# Patient Record
Sex: Female | Born: 1935 | Race: White | Hispanic: No | Smoking: Former smoker
Health system: Southern US, Community
[De-identification: ages and names within clinical notes are randomized; demographics above are authoritative.]

## PROBLEM LIST (undated history)

## (undated) DIAGNOSIS — J438 Other emphysema: Secondary | ICD-10-CM

## (undated) DIAGNOSIS — E538 Deficiency of other specified B group vitamins: Secondary | ICD-10-CM

## (undated) DIAGNOSIS — I639 Cerebral infarction, unspecified: Secondary | ICD-10-CM

## (undated) DIAGNOSIS — D649 Anemia, unspecified: Secondary | ICD-10-CM

## (undated) DIAGNOSIS — J449 Chronic obstructive pulmonary disease, unspecified: Secondary | ICD-10-CM

## (undated) DIAGNOSIS — M810 Age-related osteoporosis without current pathological fracture: Secondary | ICD-10-CM

## (undated) DIAGNOSIS — N183 Chronic kidney disease, stage 3 unspecified: Secondary | ICD-10-CM

## (undated) DIAGNOSIS — F0390 Unspecified dementia without behavioral disturbance: Secondary | ICD-10-CM

## (undated) DIAGNOSIS — Z85828 Personal history of other malignant neoplasm of skin: Secondary | ICD-10-CM

## (undated) HISTORY — DX: Deficiency of other specified B group vitamins: E53.8

## (undated) HISTORY — DX: Anemia, unspecified: D64.9

## (undated) HISTORY — DX: Unspecified dementia, unspecified severity, without behavioral disturbance, psychotic disturbance, mood disturbance, and anxiety: F03.90

## (undated) HISTORY — PX: VESICOVAGINAL FISTULA CLOSURE W/ TAH: SUR271

## (undated) HISTORY — DX: Other emphysema: J43.8

## (undated) HISTORY — PX: TOE AMPUTATION: SHX809

## (undated) HISTORY — DX: Age-related osteoporosis without current pathological fracture: M81.0

## (undated) HISTORY — DX: Chronic kidney disease, stage 3 unspecified: N18.30

## (undated) HISTORY — DX: Chronic kidney disease, stage 3 (moderate): N18.3

## (undated) HISTORY — DX: Chronic obstructive pulmonary disease, unspecified: J44.9

## (undated) HISTORY — DX: Personal history of other malignant neoplasm of skin: Z85.828

---

## 2006-10-08 ENCOUNTER — Ambulatory Visit (HOSPITAL_COMMUNITY): Admission: RE | Admit: 2006-10-08 | Discharge: 2006-10-08 | Payer: Self-pay | Admitting: Family Medicine

## 2006-11-01 ENCOUNTER — Inpatient Hospital Stay (HOSPITAL_COMMUNITY): Admission: EM | Admit: 2006-11-01 | Discharge: 2006-11-03 | Payer: Self-pay | Admitting: Emergency Medicine

## 2006-11-01 ENCOUNTER — Ambulatory Visit: Payer: Self-pay | Admitting: *Deleted

## 2006-11-02 ENCOUNTER — Encounter: Payer: Self-pay | Admitting: Cardiology

## 2009-10-04 ENCOUNTER — Encounter: Admission: RE | Admit: 2009-10-04 | Discharge: 2009-10-04 | Payer: Self-pay | Admitting: Family Medicine

## 2010-07-25 ENCOUNTER — Ambulatory Visit (HOSPITAL_COMMUNITY): Admission: RE | Admit: 2010-07-25 | Discharge: 2010-07-25 | Payer: Self-pay | Admitting: Family Medicine

## 2010-07-25 ENCOUNTER — Encounter: Payer: Self-pay | Admitting: Pulmonary Disease

## 2010-08-03 ENCOUNTER — Ambulatory Visit: Payer: Self-pay | Admitting: Pulmonary Disease

## 2010-08-03 DIAGNOSIS — J438 Other emphysema: Secondary | ICD-10-CM

## 2010-08-03 DIAGNOSIS — Z85828 Personal history of other malignant neoplasm of skin: Secondary | ICD-10-CM

## 2010-09-28 ENCOUNTER — Ambulatory Visit: Payer: Self-pay | Admitting: Pulmonary Disease

## 2010-11-28 NOTE — Assessment & Plan Note (Signed)
Summary: rov for emphysema   Visit Type:  Follow-up Copy to:  Antony Haste MD Primary Brea Coleson/Referring Jazper Nikolai:  Antony Haste MD  CC:  8 week follow up. pt states her breathing is "fine". pt c/o occas cough w/ clear phlem. pt states she currenly smokes <1 ppd. .  History of Present Illness: the pt comes in today for f/u of her known emphysema.  She was started on symbicort at the last visit, but has not really seen a difference in her breathing.  She unfortunately continues to smoke.  She denies any significant cough, congestion, or worsening sob.  Preventive Screening-Counseling & Management  Alcohol-Tobacco     Smoking Status: current     Packs/Day: 1.0     Tobacco Counseling: to quit use of tobacco products  Current Medications (verified): 1)  Alendronate Sodium 70 Mg Tabs (Alendronate Sodium) .... One Tablet By Mouth Once Weekley 2)  Omega 3 .... Once Daily 3)  Garlique 400 Mg Tbec (Garlic) .... Once Daily 4)  Ha Joint Tablets .... Once Daily 5)  Asta Fx Capsules .... Once Daily 6)  Symbicort 160-4.5 Mcg/act  Aero (Budesonide-Formoterol Fumarate) .... Two Puffs Twice Daily 7)  Proair Hfa 108 (90 Base) Mcg/act  Aers (Albuterol Sulfate) .... 2 Puffs Every 4-6 Hours As Needed  Allergies (verified): 1)  ! Asa  Social History: Packs/Day:  1.0  Review of Systems       The patient complains of shortness of breath with activity, shortness of breath at rest, and productive cough.  The patient denies non-productive cough, coughing up blood, chest pain, irregular heartbeats, acid heartburn, indigestion, loss of appetite, weight change, abdominal pain, difficulty swallowing, sore throat, tooth/dental problems, headaches, nasal congestion/difficulty breathing through nose, sneezing, itching, ear ache, anxiety, depression, hand/feet swelling, joint stiffness or pain, rash, change in color of mucus, and fever.    Vital Signs:  Patient profile:   77 year old female Height:      60  inches Weight:      75.50 pounds BMI:     14.80 O2 Sat:      99 % on Room air Temp:     97.4 degrees F oral Pulse rate:   94 / minute BP sitting:   132 / 82  (left arm) Cuff size:   small  Vitals Entered By: Carver Fila (September 28, 2010 9:12 AM)  O2 Flow:  Room air CC: 8 week follow up. pt states her breathing is "fine". pt c/o occas cough w/ clear phlem. pt states she currenly smokes <1 ppd.  Comments meds and allergies updated Phone number updated Mindy Edward Jolly  September 28, 2010 9:12 AM    Physical Exam  General:  thin female in nad Lungs:  decreased bs throughout, no wheezing noted. Heart:  rrr Extremities:  no edema or cyanosis  Neurologic:  alert and oriented, moves all 4.   Impression & Recommendations:  Problem # 1:  EMPHYSEMA (ICD-492.8) the pt feels the symbicort made no difference at all to her breathing, and also let me know that she has no intention of quitting smoking.  I have asked her to discontinue the symbicort, but to stay on as needed albuterol.  We could try spiriva, but she really does not seem interested in staying on maintenance therapy.   I talked to her again about nutrition, pulmonary rehab, and smoking cessation.  Other Orders: Est. Patient Level III (19147) Tobacco use cessation intermediate 3-10 minutes (82956)  Patient Instructions: 1)  stop symbicort and stay on albuterol as needed.  If you see a difference being off the med, can restart. 2)  think about pulmonary rehab program at [facility]. 3)  stop smoking 4)  followup with me in 6mos, but call if breathing issues develop.   Immunization History:  Pneumovax Immunization History:    Pneumovax:  historical (07/29/2008)

## 2010-11-28 NOTE — Assessment & Plan Note (Signed)
Summary: consult for emphysema   Visit Type:  Initial Consult Copy to:  Antony Haste MD Primary Provider/Referring Provider:  Antony Haste MD  CC:  shortness of breath.  History of Present Illness: The pt is a 77y/o female who is referred for evaluation of dyspnea/copd.  The pt has longstanding doe, but the daughter feels that it has gotten much worse from her observations.  The pt denies this.  She had a cxr last in 2008 which did show hyperinflation, and she has had recent spirometry which shows at least moderate airflow obstruction.  The pt has a chronic cough that is usually productive of white foamy mucus, and denies any recent pulmonary infection.  She denies any LE edema or chest pain, but has lost approx. 25 pounds over the last 1-2 yrs.  She currently is not on a bronchodilator regimen, but unfortunately continues to smoke.    Preventive Screening-Counseling & Management  Alcohol-Tobacco     Smoking Status: current  Current Medications (verified): 1)  Alendronate Sodium 70 Mg Tabs (Alendronate Sodium) .... One Tablet By Mouth Once Weekley 2)  Omega 3 .... Once Daily 3)  Garlique 400 Mg Tbec (Garlic) .... Once Daily 4)  Ha Joint Tablets .... Once Daily 5)  Asta Fx Capsules .... Once Daily  Allergies (verified): 1)  ! Jonne Ply  Past History:  Past Medical History:  SKIN CANCER, HX OF (ICD-V10.83) EMPHYSEMA (ICD-492.8)--FEV1  0.82, FEV1% 57..done 2011    Past Surgical History: hysterectomy partly ampuated right big toe  Family History: Reviewed history and no changes required. emphysema--father heart disease--mother mgm--stomach cancer mgf--colon cancer  Social History: Reviewed history and no changes required. Patient is a current smoker. <1ppd. started age 38.  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  The patient denies  shortness of breath at rest, productive cough, non-productive cough, coughing up blood, chest pain, irregular heartbeats, acid heartburn, indigestion, loss of appetite, weight change, abdominal pain, difficulty swallowing, sore throat, tooth/dental problems, headaches, nasal congestion/difficulty breathing through nose, sneezing, itching, ear ache, anxiety, depression, hand/feet swelling, joint stiffness or pain, rash, change in color of mucus, and fever.    Vital Signs:  Patient profile:   77 year old female Height:      60 inches Weight:      77 pounds BMI:     15.09 O2 Sat:      100 % on Room air Temp:     97.6 degrees F oral Pulse rate:   85 / minute BP sitting:   130 / 80  (right arm) Cuff size:   regular  Vitals Entered By: Carver Fila (August 03, 2010 11:27 AM)  O2 Flow:  Room air  Serial Vital Signs/Assessments:  Comments: Ambulatory Pulse Oximetry  Resting; HR_83____    02 Sat_99%ra____  Lap1 (185 feet)   HR_90____   02 Sat__97%ra___ Lap2 (185 feet)   HR__95___   02 Sat__98%ra___    Lap3 (185 feet)   HR__94___   02 Sat__94%ra___  _x__Test Completed without Difficulty ___Test Stopped due to:  Carver Fila  August 03, 2010 12:29 PM  By: Carver Fila   CC: shortness of breath Comments meds and allergies updated Phone number updated Mindy Silva  August 03, 2010 11:27 AM    Physical Exam  General:  thin female in nad Eyes:  PERRLA and EOMI.  Nose:  patent without discharge Mouth:  no exudates or other abnl seen Neck:  no jvd, tmg, LN Lungs:  very decreased bs throughout, no wheezing or rhonchi Heart:  rrr, no mrg Abdomen:  soft and nontender, bs+ Extremities:  no edema or cyanosis, pulses intact distally Neurologic:  alert and oriented, moves all 4.   Impression & Recommendations:  Problem # 1:  EMPHYSEMA (ICD-492.8) the pt has emphysema that is at least moderate by recent spirometry.  I have had a long discussion with her about this, and told her the  #1 treatment before everything else is smoking cessation.  We can try to improve her QOL with a bronchodilator regimen, but ultimately how she does over time will be more dependent upon her ability to quit smoking.  I have also discussed with her the importance of nutrition, and how a lot of her calories and muscle mass are being used for her respiratory muscles.  I stressed the need to increase caloric intake.  Surprisingly, her oxygen level is adequate at rest and with moderate exertion.  Finally, I have discussed the importance of an exercise program and vaccinations.  I offered to send her to pulmonary rehab if she was interested.   Medications Added to Medication List This Visit: 1)  Alendronate Sodium 70 Mg Tabs (Alendronate sodium) .... One tablet by mouth once weekley 2)  Omega 3  .... Once daily 3)  Garlique 400 Mg Tbec (Garlic) .... Once daily 4)  Ha Joint Tablets  .... Once daily 5)  Asta Fx Capsules  .... Once daily 6)  Symbicort 160-4.5 Mcg/act Aero (Budesonide-formoterol fumarate) .... Two puffs twice daily 7)  Proair Hfa 108 (90 Base) Mcg/act Aers (Albuterol sulfate) .... 2 puffs every 4-6 hours as needed  Other Orders: Pulse Oximetry, Ambulatory (95188) Tobacco use cessation intermediate 3-10 minutes (41660) Consultation Level IV (63016) T-2 View CXR (71020TC) Future Orders: Flu Vaccine 39yrs + MEDICARE PATIENTS (W1093) ... 08/04/2010 Administration Flu vaccine - MCR (G0008) ... 08/04/2010  Patient Instructions: 1)  will try symbicort 160/4.5  2 inhalations am and pm....rinse mouth well 2)  can use proair 2 puffs every 6 hrs if needed for rescue 3)  stop smoking! 4)  will check cxr today, and will call you with the results. 5)  followup with me in 8 weeks to check on your progress.  Prescriptions: PROAIR HFA 108 (90 BASE) MCG/ACT  AERS (ALBUTEROL SULFATE) 2 puffs every 4-6 hours as needed  #1 x 6   Entered and Authorized by:   Barbaraann Share MD   Signed by:   Barbaraann Share MD on 08/03/2010   Method used:   Print then Give to Patient   RxID:   [id number] SYMBICORT 160-4.5 MCG/ACT  AERO (BUDESONIDE-FORMOTEROL FUMARATE) Two puffs twice daily  #1 x 6   Entered and Authorized by:   Barbaraann Share MD   Signed by:   Barbaraann Share MD on 08/03/2010   Method used:   Print then Give to Patient   RxID:   [id number]     Immunization History:  Influenza Immunization History:    Influenza:  historical (07/29/2009)       Flu Vaccine Consent Questions     Do you have a history of severe allergic reactions to this vaccine? no    Any prior history of allergic reactions to egg and/or gelatin? no    Do you have a sensitivity to the preservative Thimersol? no  Do you have a past history of Guillan-Barre Syndrome? no    Do you currently have an acute febrile illness? no    Have you ever had a severe reaction to latex? no    Vaccine information given and explained to patient? yes    Are you currently pregnant? no    Lot Number:[id number]   Exp Date:04/28/2011   Site Given  Left hip IMdflu

## 2011-03-16 NOTE — H&P (Signed)
NAMEPAISLYN, Donna Burke NO.:  [id number]   MEDICAL RECORD NO.:  [id number]          PATIENT TYPE:  INP   LOCATION:  1825                         FACILITY:  [facility]   PHYSICIAN:  Rod Holler, MD     DATE OF BIRTH:  1934/05/20   DATE OF ADMISSION:  11/01/2006  DATE OF DISCHARGE:                              HISTORY & PHYSICAL   HISTORY OF PRESENT ILLNESS:  [name] is a 77 year old female with an  extensive tobacco history, hyperlipidemia, who presents to emergency  department with complaint of shortness of breath. Approximately 1 week  ago, the patient was treated for an upper respiratory infection with  antibiotics. Over the past day, the patient has had progressively  worsening shortness of breath at all times. The patient has also  complained of a cough productive of white sputum. She has had no chest  pain. She has had no episodes of palpitations, no paroxysmal nocturnal  dyspnea, orthopnea, no lower extremity swelling. She has no fevers,  chills, night sweats. She does have a cough productive of white sputum.  In the emergency department the patient was initially treated with  albuterol and Atrovent, along with Solu-Medrol. The patient was also  written to receive aspirin, heparin bolus, and nitroglycerin.   PAST MEDICAL HISTORY:  1. Ongoing tobacco use.  2. Hyperlipidemia.  3. Osteoporosis.  4. Degenerative disk disease.   MEDICATIONS:  1. Zocor.  2. Fosamax.   ALLERGIES:  No known drug allergies.   SOCIAL HISTORY:  The patient smokes one pack per day and has done so for  over 60 years. She is widowed, lives by herself.   FAMILY HISTORY:  Mother with history of coronary artery disease status  post CABG. Coronary disease started in her 67's.   REVIEW OF SYSTEMS:  All systems were reviewed in detail and are negative  except as noted in the history of present illness.   PHYSICAL EXAMINATION:  VITAL SIGNS: Blood pressure 172/95, heart rate  94,  oxygen saturation 95% on 2 liters by nasal cannula. Temperature 97.  Respiratory rate currently 16.  GENERAL: A thin, elderly female, alert and oriented x3, in no  respiratory distress.  HEENT:  Head atraumatic normocephalic. Pupils are equal, round and  reactive to light. Extraocular movements are intact. Oropharynx clear.  NECK: Supple, no adenopathy.  CHEST: Diffuse end expiratory wheezing, equal breath sounds bilaterally,  prolonged end expiratory phase.  CORONARY: Distant heart sounds, regular, no murmurs, rubs, or gallops.  ABDOMEN: Soft, nontender, nondistended. Active bowel sounds.  EXTREMITIES: No clubbing, cyanosis or edema.  NEUROLOGIC: No focal deficits.   EKG shows normal sinus rhythm, poor quality baseline, no obvious ST or T  wave changes.   Chest x-ray consistent with chronic obstructive pulmonary disease,  bibasilar scarring, no edema.   LABORATORY DATA:  D-dimer 0.35. BNP 192. Hematocrit 45. Sodium 139,  potassium 4.8, chloride 112, bicarb 23, BUN 41, creatinine 1.5, glucose  116.   IMPRESSION:  Probable chronic obstructive pulmonary disease  exacerbation, chest x-ray with COPD changes. The patient's shortness of  breath improved  with nebulizer treatments in the emergency department.  The patient has had no episodes of chest pain, had a peak troponin of  0.08, last troponin of less than 0.05.   PLAN:  1. Pulmonary. Admit the patient to a telemetry bed, albuterol,      Atrovent nebulizer treatments q.4 hours, prednisone 60 mg p.o.      daily starting tomorrow, azithromycin 500 mg p.o. x1, then 250 mg      p.o. daily x4 days, oxygen by nasal cannula to keep oxygen      saturation greater than 90%.  2. Cardiovascular. Telemetry, rule out serial cardiac enzymes,      aspirin, Zocor, no beta blocker given the patient's lung disease.      Hold heparin and nitroglycerin at present. Transthoracic      echocardiogram. Daily EKG x3 days. Lipid panel in the morning.  3.  GI. Protonix daily.  4. Fluid, electrolytes, nutrition. Regular diet.  5. Renal. I will hold ACE inhibitor given her renal function, and will      recheck renal function in the morning.  6. Morning labs. CMP, CBC, lipid panel, thyroid function tests,      magnesium, coagulation panel.  7. Prophylaxis. Heparin 5000 units SQ t.i.d.    Dictated by Joaquim Lai, M.D.      Rod Holler, MD  Electronically Signed     TRK/MEDQ  D:  11/01/2006  T:  11/01/2006  Job:  [id number]

## 2011-03-16 NOTE — Discharge Summary (Signed)
NAMESHANEQUE, MERKLE NO.:  [id number]   MEDICAL RECORD NO.:  [id number]          PATIENT TYPE:  INP   LOCATION:  3714                         FACILITY:  [facility]   PHYSICIAN:  Jonelle Sidle, MD DATE OF BIRTH:  10/31/1933   DATE OF ADMISSION:  11/01/2006  DATE OF DISCHARGE:  11/03/2006                               DISCHARGE SUMMARY   PRIMARY CARE PHYSICIAN:  Ms. Renette Butters, [place].A. with [facility].   PROCEDURE PERFORMED:  None.   HISTORY OF PRESENT ILLNESS:  A 77 year old Caucasian female with a  history of tobacco abuse, hyperlipidemia presented to the emergency  department with complaints of increasing shortness of breath.  Approximately 1 week prior, the patient was treated for upper  respiratory infection with antibiotics.  Over the past 24 hours prior to  admission, the patient had progressively worsening shortness of breath  at all times with also productive cough with white sputum.  She denied  any chest pain or shortness of breath.  No episodes of palpitation, PND,  orthopnea, or lower extremity edema.  She also denied any fevers,  chills, or night sweats.  Her main complaint was shortness of breath  with a productive cough.   HOSPITAL COURSE:  The patient was seen and examined by Dr. Joaquim Lai,  cardiology from [facility].  The patient was initially treated with  Albuterol and Atrovent inhalers and started on Solu-Medrol.  The patient  also was started on heparin bolus and nitroglycerine prophylactically  and the patient was admitted to rule out cardiovascular etiology for  shortness of breath.  The patient was also started back on Zithromax 250  mg 1 p.o. daily and started on Combivent inhaler along with continued  prednisone dosing during the hospitalization.  On arrival the patient's  blood pressure was found to be 172/95, heart rate 94, O2 sat 95% on 2  liters by nasal cannula, temperature 97.  The patient was breathing at  rate of  16-20 per minute.   Chest x-ray was completed on admission revealing advanced changes of  COPD with probable bibasilar scaring or atelectasis.  No edema,  effusions, or focal infiltrates were noted.  Echocardiogram was also  completed revealing left ventricle with small overall left ventricular  systolic function, normal left ventricular ejection fraction was  estimated at a range being 60-65% with no left ventricular regional wall  motion abnormalities.  There was mild right ventricular hypertrophy,  mild tricuspid valvular regurg.  There appeared to be a small  pericardial effusion anterior to the heart.   The patient was also consulted on smoking cessation during  hospitalization and Chantix was offered as an outpatient.  The patient's  initial BNP was found to be 192 on admission.  The patient's troponin's  were found to be negative throughout hospitalization.  Troponin was  found to be 0.05, BNP 192.  Subsequent troponins were 0.01, 0.01, and  0.01 respectively.  The patient's breathing status improved and she was  seen and examined by Dr. Diona Browner on day of discharge and found to be  stable for discharge.  LABORATORY DATA:  Hemoglobin 12.1, hematocrit 35.9, white blood cells  5.3, platelets 289.  PT 13.8, INR 1.0, PTT 114.  D-dimer 0.35.  Sodium  143, potassium 5.0, chloride 108, CO2 26, glucose 114, BUN 50,  creatinine 1.45.  Cholesterol 107, triglycerides 45, HDL 64, LDL 34.  TSH 0.24.   DISCHARGE BLOOD PRESSURE:  Systolic blood pressure is running 97-120,  heart rate 80s to 90s, O2 sat 96% on room air.   DISPOSITION/INSTRUCTIONS:  1. The patient will up with her primary care physician Ms. Debera Lat.  The patient is to call for followup appointment.  Cardiology      follow-up can be PRN.  2. The patient has been given lengthy instructions concerning her home      medications to include prednisone at a divided dose and decreased      regiment over the next 3 weeks.   The patient has also been given      instructions on Chantix with directions provided on dosing.  The      patient has been started on a new medication Cardizem 120 mg 1 p.o.      daily and this has been explained to her as well.  She will      continue her other medications as directed.   DISCHARGE MEDICATIONS:  1. Zithromax 250 mg 1 p.o. daily x5 days.  2. Predinsone 60 mg daily x4 days, then 40 mg daily x4 days, 20 mg      daily x4 days, and 10 mg daily x4 days respectively, and then      discontinue.  3. Combivent MDI q.i.d. 2 puffs.  4. Chantix as directed 0.5 mg 1 p.o. daily x3 days, then 0.5 mg 1 p.o.      b.i.d. x4 days, then 1 mg p.o. b.i.d. for 11 weeks.  5. Zocor 20 mg once a day.  6. Cardizem 120 mg once a day.  7. Aspirin 325 once a day.  8. Protonix 40 mg once a day.   ALLERGIES:  ASPIRIN.   Time spent with patient to include physician time, 35 minutes.      Bettey Mare. Lyman Bishop, NP      Jonelle Sidle, MD  Electronically Signed    KML/MEDQ  D:  11/03/2006  T:  11/03/2006  Job:  [id number]

## 2011-03-20 ENCOUNTER — Encounter: Payer: Self-pay | Admitting: Pulmonary Disease

## 2011-03-28 ENCOUNTER — Ambulatory Visit: Payer: Self-pay | Admitting: Pulmonary Disease

## 2011-10-04 ENCOUNTER — Other Ambulatory Visit: Payer: Self-pay | Admitting: Pulmonary Disease

## 2012-09-24 ENCOUNTER — Other Ambulatory Visit: Payer: Self-pay | Admitting: Family Medicine

## 2012-09-24 DIAGNOSIS — M79606 Pain in leg, unspecified: Secondary | ICD-10-CM

## 2012-09-30 ENCOUNTER — Ambulatory Visit
Admission: RE | Admit: 2012-09-30 | Discharge: 2012-09-30 | Disposition: A | Discharge status: Home / Self Care | Payer: Medicare Other | Source: Ambulatory Visit | Attending: Family Medicine | Admitting: Family Medicine

## 2012-09-30 DIAGNOSIS — M79606 Pain in leg, unspecified: Secondary | ICD-10-CM

## 2013-01-01 ENCOUNTER — Other Ambulatory Visit: Payer: Self-pay | Admitting: Pulmonary Disease

## 2013-03-24 ENCOUNTER — Other Ambulatory Visit: Payer: Self-pay | Admitting: *Deleted

## 2013-03-24 ENCOUNTER — Encounter: Payer: Self-pay | Admitting: Neurology

## 2013-03-24 ENCOUNTER — Ambulatory Visit (INDEPENDENT_AMBULATORY_CARE_PROVIDER_SITE_OTHER): Payer: Medicare Other | Admitting: Neurology

## 2013-03-24 VITALS — BP 107/66 | HR 89 | Temp 97.5°F | Ht 59.0 in | Wt 79.0 lb

## 2013-03-24 DIAGNOSIS — G309 Alzheimer's disease, unspecified: Secondary | ICD-10-CM

## 2013-03-24 DIAGNOSIS — F068 Other specified mental disorders due to known physiological condition: Secondary | ICD-10-CM

## 2013-03-24 DIAGNOSIS — I6350 Cerebral infarction due to unspecified occlusion or stenosis of unspecified cerebral artery: Secondary | ICD-10-CM

## 2013-03-24 DIAGNOSIS — F0280 Dementia in other diseases classified elsewhere without behavioral disturbance: Secondary | ICD-10-CM | POA: Insufficient documentation

## 2013-03-24 DIAGNOSIS — I6381 Other cerebral infarction due to occlusion or stenosis of small artery: Secondary | ICD-10-CM | POA: Insufficient documentation

## 2013-03-24 NOTE — Progress Notes (Signed)
[facility] [address] [place]. [place] [address] [phone]       OFFICE FOLLOW-UP NOTE  [name] Date of Birth:  07/03/1934 Medical Record Number:  [id number]   HPI:79 year old lady with left thalamic infarct in October 2013 with prior history of multiple TIAs and long-standing mild dementia of  Alzheimer`s type . She returns for followup of her last visit on 12/23/2012. She is accompanied by her daughter. Her short-term memory difficulties, cognitive impairment remain unchanged. She continues to live alone with close supervision by her family. She needs a lot of help from her family though she denies this and minimizes her symptoms. She has not had any falls though she continues to have mild balance problems. She denies any delusions, hallucinations or significant changes in behavior hormone he did she continues to lack insight into her cognitive difficulties and states there is nothing wrong and minimizes her problem. She has not had any stroke or TIA symptoms and remains on aspirin.   ROS:   14 system review of systems is positive for  easy bruising, bleeding, memory loss, hearing loss  PMH:  Past Medical History  Diagnosis Date  . Personal history of other malignant neoplasm of skin   . Other emphysema   . Dementia   . COPD (chronic obstructive pulmonary disease)   . Anemia   . Osteoporosis   . Vitamin B 12 deficiency   . CKD (chronic kidney disease), stage III     Social History:  History   Social History  . Marital Status: Divorced    Spouse Name: N/A    Number of Children: N/A  . Years of Education: N/A   Occupational History  . Retired    Social History Main Topics  . Smoking status: Current Every Day Smoker  . Smokeless tobacco: Not on file     Comment: < 1ppd  . Alcohol Use: Not on file  . Drug Use: Not on file  . Sexually Active: Not on file   Other Topics Concern  . Not on file   Social History Narrative  . No narrative on  file    Medications:   Current Outpatient Prescriptions on File Prior to Visit  Medication Sig Dispense Refill  . albuterol (PROAIR HFA) 108 (90 BASE) MCG/ACT inhaler Inhale 2 puffs into the lungs every 6 (six) hours as needed.        Marland Kitchen alendronate (FOSAMAX) 70 MG tablet Take 70 mg by mouth every 7 (seven) days. Take with a full glass of water on an empty stomach.       . budesonide-formoterol (SYMBICORT) 160-4.5 MCG/ACT inhaler Inhale 2 puffs into the lungs 2 (two) times daily.        . Garlic (GARLIQUE) 400 MG TBEC Take 1 tablet by mouth daily.        . OMEGA 3 1000 MG CAPS Take 1 capsule by mouth daily.         No current facility-administered medications on file prior to visit.    Allergies:   Allergies  Allergen Reactions  . Aspirin     REACTION: nausea    Physical Exam General: Frail petite elderly Caucasian lady, seated, in no evident distress Head: head normocephalic and atraumatic. Orohparynx benign Neck: supple with no carotid or supraclavicular bruits Cardiovascular: regular rate and rhythm, no murmurs Musculoskeletal: no deformity. Mild kyphosis Skin:  no rash/petichiae Vascular:  Normal pulses all extremities  Neurologic Exam Mental Status: Awake and fully alert. Oriented  to place and time. Recent and remote memory diminished t. Attention span, concentration and fund of knowledge diminished Mood and affect appropriate. Mini-Mental status exam score 25/30 with deficits in orientation, recall. Animal fluency test 7. Clock drawing 3/4. Geriatric depression scale and not depressed Cranial Nerves: Fundoscopic exam reveals sharp disc margins. Pupils equal, briskly reactive to light. Extraocular movements full without nystagmus. Visual fields full to confrontation. Hearing intact. Facial sensation intact. Face, tongue, palate moves normally and symmetrically.  Motor: Normal bulk and tone. Normal strength in all tested extremity muscles. Sensory.: intact to tough and pinprick  and vibratory.  Coordination: Rapid alternating movements normal in all extremities. Finger-to-nose and heel-to-shin performed accurately bilaterally. Gait and Station: Arises from chair without difficulty. Stance is normal. Gait demonstrates normal stride length and balance . Unable to heel, toe and tandem walk without difficulty.  Reflexes: 1+ and symmetric. Toes downgoing.     ASSESSMENT: 79 year old lady with left thalamic infarct in October 2013 with prior history of multiple TIAs and long-standing mild dementia of  Alzheimer`s type .    PLAN: Continue aspirin for stroke prevention and fish oil and cephalad and a C4 dementia. She has been unable to tolerate Aricept and Exelon in the past. Consider participation in the [facility] 3 dementia trial. Return for followup in 3 months.

## 2013-03-24 NOTE — Patient Instructions (Signed)
Continue aspirin for stroke prevention and fish oil and Cerefolin NAC for dementia. Patient has been intolerant to Aricept and Exelon in the past. Consider participating in the [place] 3 dementia trial if interested. Return for followup in 3 months.

## 2013-07-09 ENCOUNTER — Ambulatory Visit: Payer: Medicare Other | Admitting: Neurology

## 2013-09-21 ENCOUNTER — Encounter: Payer: Self-pay | Admitting: Neurology

## 2013-09-21 ENCOUNTER — Encounter (INDEPENDENT_AMBULATORY_CARE_PROVIDER_SITE_OTHER): Payer: Self-pay

## 2013-09-21 ENCOUNTER — Ambulatory Visit (INDEPENDENT_AMBULATORY_CARE_PROVIDER_SITE_OTHER): Payer: Medicare Other | Admitting: Neurology

## 2013-09-21 VITALS — BP 85/66 | HR 108 | Ht 59.0 in | Wt 80.0 lb

## 2013-09-21 DIAGNOSIS — F0280 Dementia in other diseases classified elsewhere without behavioral disturbance: Secondary | ICD-10-CM

## 2013-09-21 NOTE — Progress Notes (Signed)
[facility] [address] [place]. [place] [address] [phone]       OFFICE FOLLOW-UP NOTE  Donna Burke Date of Birth:  [birth year] Medical Record Number:  [id number]   HPI:79 year old lady with left thalamic infarct in October 2013 with prior history of multiple TIAs and long-standing mild dementia of  Alzheimer`s type . She returns for followup of her last visit on 12/23/2012. She is accompanied by her daughter. Her short-term memory difficulties, cognitive impairment remain unchanged. She continues to live alone with close supervision by her family. She needs a lot of help from her family though she denies this and minimizes her symptoms. She has not had any falls though she continues to have mild balance problems. She denies any delusions, hallucinations or significant changes in behavior hormone he did she continues to lack insight into her cognitive difficulties and states there is nothing wrong and minimizes her problem. She has not had any stroke or TIA symptoms and remains on aspirin.  Update 09/21/13  She returns for f/u after last visit 03/24/13. She is accompanied by her daughter who states she has noticed worsening memory loss and cognitive decline. She lives alone but daughter has been helping her a lot more .She has not had any new health problems.she has no problems with gait, balance,hallucinations, agitation, behaviour problems.Daughter is thinking about long term care plans now.She has not tolerated aricept and exelon and decided not to participate in [facility] 3 trial. ROS:   14 system review of systems is positive for  easy bruising, bleeding, memory loss, hearing loss,confusion  PMH:  Past Medical History  Diagnosis Date  . Personal history of other malignant neoplasm of skin   . Other emphysema   . Dementia   . COPD (chronic obstructive pulmonary disease)   . Anemia   . Osteoporosis   . Vitamin B 12 deficiency   . CKD (chronic kidney disease),  stage III     Social History:  History   Social History  . Marital Status: Divorced    Spouse Name: N/A    Number of Children: N/A  . Years of Education: N/A   Occupational History  . Retired    Social History Main Topics  . Smoking status: Current Every Day Smoker  . Smokeless tobacco: Not on file     Comment: < 1ppd  . Alcohol Use: Not on file  . Drug Use: Not on file  . Sexual Activity: Not on file   Other Topics Concern  . Not on file   Social History Narrative  . No narrative on file    Medications:   Current Outpatient Prescriptions on File Prior to Visit  Medication Sig Dispense Refill  . budesonide-formoterol (SYMBICORT) 160-4.5 MCG/ACT inhaler Inhale 2 puffs into the lungs 2 (two) times daily.        Marland Kitchen FOLIC ACID PO Take by mouth. Take as directed      . OMEGA 3 1000 MG CAPS Take 1 capsule by mouth daily.        . RESTASIS 0.05 % ophthalmic emulsion       . albuterol (PROAIR HFA) 108 (90 BASE) MCG/ACT inhaler Inhale 2 puffs into the lungs every 6 (six) hours as needed.        Marland Kitchen alendronate (FOSAMAX) 70 MG tablet Take 70 mg by mouth every 7 (seven) days. Take with a full glass of water on an empty stomach.       . Garlic (GARLIQUE) 400 MG  TBEC Take 1 tablet by mouth daily.        Marland Kitchen L-Methylfolate-B12-B6-B2 (CEREFOLIN PO) Take by mouth. Take as directed      . terbinafine (LAMISIL) 250 MG tablet       . tobramycin-dexamethasone (TOBRADEX) ophthalmic solution        No current facility-administered medications on file prior to visit.    Allergies:   Allergies  Allergen Reactions  . Aspirin     REACTION: nausea   Filed Vitals:   09/21/13 1436  BP: 85/66  Pulse: 108     Physical Exam General: Frail petite elderly Caucasian lady, seated, in no evident distress Head: head normocephalic and atraumatic. Orohparynx benign Neck: supple with no carotid or supraclavicular bruits Cardiovascular: regular rate and rhythm, no murmurs Musculoskeletal: no  deformity. Mild kyphosis Skin:  no rash/petichiae Vascular:  Normal pulses all extremities  Neurologic Exam Mental Status: Awake and fully alert. Oriented to place and time. Recent and remote memory diminished t. Attention span, concentration and fund of knowledge diminished Mood and affect appropriate. Mini-Mental status exam score 25/30 with deficits in orientation, recall. Animal fluency test 7. Clock drawing 3/4. Geriatric depression scale and not depressed Cranial Nerves: Fundoscopic exam reveals sharp disc margins. Pupils equal, briskly reactive to light. Extraocular movements full without nystagmus. Visual fields full to confrontation. Hearing intact. Facial sensation intact. Face, tongue, palate moves normally and symmetrically.  Motor: Normal bulk and tone. Normal strength in all tested extremity muscles. Sensory.: intact to touch and pinprick and vibratory sensation.  Coordination: Rapid alternating movements normal in all extremities. Finger-to-nose and heel-to-shin performed accurately bilaterally. Gait and Station: Arises from chair without difficulty. Stance is normal. Gait demonstrates normal stride length and balance . Unable to heel, toe and tandem walk without difficulty.  Reflexes: 1+ and symmetric. Toes downgoing.     ASSESSMENT: 79 year old lady with left thalamic infarct in October 2013 with prior history of multiple TIAs and long-standing mild dementia of  Alzheimer`s type .    PLAN:  Patient was given a starter pack of of Namenda to start at 5 mg daily and titrate up to 20 mg. If patient can tolerate this will switch her to Namenda XR 28 mg daily. I discussed possible side effects with the patient's daughter and answered questions. She was also advised to look at his assisted living situation due to patient's worsening dementia . She was advised to call back in a month to get a prescription for Namenda 28 mg XR. Return for followup in 3 months or call earlier if  necessary.

## 2013-09-21 NOTE — Patient Instructions (Signed)
Patient was given a starter pack of of Namenda to start at 5 mg daily and titrate up to 20 mg. If patient can tolerate this will switch her to Namenda XR 28 mg daily. I discussed possible side effects with the patient's daughter and answered questions. She was also advised to look at his assisted living situation due to patient's worsening dementia . She was advised to call back in a month to get a prescription for Namenda 28 mg XR. Return for followup in 3 months or call earlier if necessary.

## 2013-11-25 ENCOUNTER — Ambulatory Visit: Payer: Medicare Other | Admitting: Neurology

## 2016-03-28 ENCOUNTER — Other Ambulatory Visit: Payer: Self-pay | Admitting: Orthopedic Surgery

## 2016-04-26 ENCOUNTER — Ambulatory Visit (HOSPITAL_BASED_OUTPATIENT_CLINIC_OR_DEPARTMENT_OTHER): Admission: RE | Admit: 2016-04-26 | Payer: Medicare Other | Source: Ambulatory Visit | Admitting: Orthopedic Surgery

## 2016-04-26 ENCOUNTER — Encounter (HOSPITAL_BASED_OUTPATIENT_CLINIC_OR_DEPARTMENT_OTHER): Admission: RE | Payer: Self-pay | Source: Ambulatory Visit

## 2016-04-26 SURGERY — EXCISION MASS
Anesthesia: Choice | Laterality: Left

## 2017-02-20 ENCOUNTER — Encounter (HOSPITAL_COMMUNITY): Payer: Self-pay | Admitting: Emergency Medicine

## 2017-02-20 ENCOUNTER — Emergency Department (HOSPITAL_COMMUNITY): Payer: Medicare HMO

## 2017-02-20 ENCOUNTER — Emergency Department (HOSPITAL_COMMUNITY)
Admission: EM | Admit: 2017-02-20 | Discharge: 2017-02-20 | Disposition: A | Discharge status: Home / Self Care | Payer: Medicare HMO | Attending: Emergency Medicine | Admitting: Emergency Medicine

## 2017-02-20 DIAGNOSIS — Y92002 Bathroom of unspecified non-institutional (private) residence single-family (private) house as the place of occurrence of the external cause: Secondary | ICD-10-CM | POA: Diagnosis not present

## 2017-02-20 DIAGNOSIS — Z85828 Personal history of other malignant neoplasm of skin: Secondary | ICD-10-CM | POA: Diagnosis not present

## 2017-02-20 DIAGNOSIS — S42211A Unspecified displaced fracture of surgical neck of right humerus, initial encounter for closed fracture: Secondary | ICD-10-CM | POA: Diagnosis not present

## 2017-02-20 DIAGNOSIS — J449 Chronic obstructive pulmonary disease, unspecified: Secondary | ICD-10-CM | POA: Insufficient documentation

## 2017-02-20 DIAGNOSIS — Z79899 Other long term (current) drug therapy: Secondary | ICD-10-CM | POA: Insufficient documentation

## 2017-02-20 DIAGNOSIS — Z87891 Personal history of nicotine dependence: Secondary | ICD-10-CM | POA: Diagnosis not present

## 2017-02-20 DIAGNOSIS — S42291A Other displaced fracture of upper end of right humerus, initial encounter for closed fracture: Secondary | ICD-10-CM

## 2017-02-20 DIAGNOSIS — Z8673 Personal history of transient ischemic attack (TIA), and cerebral infarction without residual deficits: Secondary | ICD-10-CM | POA: Diagnosis not present

## 2017-02-20 DIAGNOSIS — W182XXA Fall in (into) shower or empty bathtub, initial encounter: Secondary | ICD-10-CM | POA: Insufficient documentation

## 2017-02-20 DIAGNOSIS — Y9389 Activity, other specified: Secondary | ICD-10-CM | POA: Insufficient documentation

## 2017-02-20 DIAGNOSIS — N183 Chronic kidney disease, stage 3 (moderate): Secondary | ICD-10-CM | POA: Insufficient documentation

## 2017-02-20 DIAGNOSIS — Y999 Unspecified external cause status: Secondary | ICD-10-CM | POA: Insufficient documentation

## 2017-02-20 DIAGNOSIS — Z7982 Long term (current) use of aspirin: Secondary | ICD-10-CM | POA: Insufficient documentation

## 2017-02-20 DIAGNOSIS — G309 Alzheimer's disease, unspecified: Secondary | ICD-10-CM | POA: Diagnosis not present

## 2017-02-20 DIAGNOSIS — W19XXXA Unspecified fall, initial encounter: Secondary | ICD-10-CM

## 2017-02-20 DIAGNOSIS — S4991XA Unspecified injury of right shoulder and upper arm, initial encounter: Secondary | ICD-10-CM | POA: Diagnosis present

## 2017-02-20 HISTORY — DX: Cerebral infarction, unspecified: I63.9

## 2017-02-20 MED ORDER — TRAMADOL HCL 50 MG PO TABS: 50.0000 mg | ORAL_TABLET | Freq: Four times a day (QID) | ORAL | 0 refills | Status: AC | PRN

## 2017-02-20 MED ORDER — POLYETHYLENE GLYCOL 3350 17 G PO PACK: 17.0000 g | PACK | Freq: Every day | ORAL | 0 refills | Status: AC

## 2017-02-20 MED ORDER — TRAMADOL HCL 50 MG PO TABS
50.0000 mg | ORAL_TABLET | Freq: Once | ORAL | Status: AC
  Administered 2017-02-20: 50 mg via ORAL
  Filled 2017-02-20: qty 1

## 2017-02-20 MED ORDER — FENTANYL CITRATE (PF) 100 MCG/2ML IJ SOLN
25.0000 ug | Freq: Once | INTRAMUSCULAR | Status: AC
  Administered 2017-02-20: 25 ug via INTRAVENOUS
  Filled 2017-02-20: qty 2

## 2017-02-20 NOTE — Discharge Instructions (Signed)
Please follow-up with orthopedic surgery regarding her fracture in 1-2 weeks. Keep sling for comfort. Medications as prescribed. Return without fail for worsening symptoms, including recurrent falls, passing out, new numbness or weakness of the arm, escalating pain, or any other symptoms concerning to you

## 2017-02-20 NOTE — ED Triage Notes (Signed)
Pt st's she fell this am in bathtub.  Pt c/o pain to right shoulder, right humerus and right elbow.  Strong radial pulse present.

## 2017-02-20 NOTE — ED Provider Notes (Signed)
[facility]-EMERGENCY DEPT Provider Note   CSN: [id number] Arrival date & time: 02/20/17  1625     History   Chief Complaint Chief Complaint  Patient presents with  . Fall    HPI Donna Burke is a 83 y.o. female.  The history is provided by the patient.  Fall  This is a new problem. The current episode started 6 to 12 hours ago. The problem occurs rarely. The problem has been resolved. Pertinent negatives include no chest pain, no abdominal pain, no headaches and no shortness of breath. Exacerbated by: movement of right arm. Nothing relieves the symptoms. She has tried nothing for the symptoms.   83 year old female who presents after fall with right arm pain. She has a history of dementia, prior CVA, COPD, and CK D. She does not take any blood thinners. States that she fell early this morning, it is unclear of what caused her fall. Her daughter suspects that she lost her balance while she was getting up to use the toilet. Patient is unsure of if she hit her head or had loss of consciousness. Since then has been complaining of right shoulder and arm pain. Did not take any medications prior to arrival. Pain is worse with movement of the right arm and palpation. She denies any headache, vision changes, neck pain or back pain, abdominal pain, difficulty breathing or chest pain. Otherwise has been in her usual state of health.   Past Medical History:  Diagnosis Date  . Anemia   . CKD (chronic kidney disease), stage III   . COPD (chronic obstructive pulmonary disease) (HCC)   . Dementia   . Osteoporosis   . Other emphysema (HCC)   . Personal history of other malignant neoplasm of skin   . Stroke (HCC)   . Vitamin B 12 deficiency     Patient Active Problem List   Diagnosis Date Noted  . Alzheimer's disease 03/24/2013  . 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 Lacunar infarction (HCC) 03/24/2013  . EMPHYSEMA 08/03/2010  . SKIN CANCER, HX OF  08/03/2010    Past Surgical History:  Procedure Laterality Date  . TOE AMPUTATION    . VESICOVAGINAL FISTULA CLOSURE W/ TAH      OB History    No data available       Home Medications    Prior to Admission medications   Medication Sig Start Date End Date Taking? Authorizing Provider  albuterol (PROAIR HFA) 108 (90 BASE) MCG/ACT inhaler Inhale 2 puffs into the lungs every 6 (six) hours as needed.      Historical Provider, MD  alendronate (FOSAMAX) 70 MG tablet Take 70 mg by mouth every 7 (seven) days. Take with a full glass of water on an empty stomach.     Historical Provider, MD  aspirin 81 MG tablet Take 81 mg by mouth daily.    Historical Provider, MD  budesonide-formoterol (SYMBICORT) 160-4.5 MCG/ACT inhaler Inhale 2 puffs into the lungs 2 (two) times daily.      Historical Provider, MD  Calcium-Magnesium (CAL-MAG PO) Take by mouth.    Historical Provider, MD  cholecalciferol (VITAMIN D) 1000 UNITS tablet Take 1,000 Units by mouth daily. 2 tabs QD 4000    Historical Provider, MD  FOLIC ACID PO Take by mouth. Take as directed    Historical Provider, MD  Garlic (GARLIQUE) 400 MG TBEC Take 1 tablet by mouth daily.      Historical Provider, MD  L-Methylfolate-B12-B6-B2 (CEREFOLIN PO)  Take by mouth. Take as directed    Historical Provider, MD  multivitamin-lutein (OCUVITE-LUTEIN) CAPS capsule Take 1 capsule by mouth daily.    Historical Provider, MD  OMEGA 3 1000 MG CAPS Take 1 capsule by mouth daily.      Historical Provider, MD  polyethylene glycol (MIRALAX / GLYCOLAX) packet Take 17 g by mouth daily. 02/20/17   Lavera Guise, MD  RESTASIS 0.05 % ophthalmic emulsion  02/04/13   Historical Provider, MD  terbinafine (LAMISIL) 250 MG tablet  01/01/13   Historical Provider, MD  tobramycin-dexamethasone Wallene Dales) ophthalmic solution  03/11/13   Historical Provider, MD  traMADol (ULTRAM) 50 MG tablet Take 1 tablet (50 mg total) by mouth every 6 (six) hours as needed for moderate pain or severe  pain. 02/20/17   Lavera Guise, MD    Family History Family History  Problem Relation Age of Onset  . Emphysema Father   . Heart disease Mother   . Colon cancer Maternal Grandmother   . Stomach cancer Maternal Grandfather     Social History Social History  Substance Use Topics  . Smoking status: Former Games developer  . Smokeless tobacco: Never Used     Comment: < 1ppd  . Alcohol use No     Allergies   Aspirin   Review of Systems Review of Systems  Constitutional: Negative for fever.  Respiratory: Negative for shortness of breath.   Cardiovascular: Negative for chest pain.  Gastrointestinal: Negative for abdominal pain.  Musculoskeletal: Negative for back pain and neck pain.  Skin: Negative for wound.  Allergic/Immunologic: Negative for immunocompromised state.  Neurological: Negative for headaches.  Hematological: Does not bruise/bleed easily.  Psychiatric/Behavioral: Negative for confusion.  All other systems reviewed and are negative.    Physical Exam Updated Vital Signs BP 92/61   Pulse 99   Temp 97.5 F (36.4 C) (Oral)   Resp 11   Ht  (1.422 m)   Wt 72 lb (32.7 kg)   SpO2 96%   BMI 16.14 kg/m   Physical Exam Physical Exam  Nursing note and vitals reviewed. Constitutional: Elderly and frail appearing woman, non-toxic, and in no acute distress. Does appear uncomfortable secondary to pain Head: Normocephalic and atraumatic.  Eyes: PERRL, EOMI Mouth/Throat: Oropharynx is clear and moist.  Neck: Normal range of motion. Neck supple. no cervical spine tenderness Cardiovascular: Normal rate and regular rhythm.   +2 radial pulses bilaterally Pulmonary/Chest: Effort normal and breath sounds normal. no chest wall tenderness Abdominal: Soft. There is no tenderness. There is no rebound and no guarding.  Musculoskeletal: Unable to ROM right shoulder due to pain. There is soft tissue swelling to the anterior shoulder.  Neurological: Alert, no facial droop, fluent  speech, intact innervation involving the radial, ulnar, and median nerves of the right upper extremity. Remaining 3 extremities symmetrically to command. Skin: Skin is warm and dry.  Psychiatric: Cooperative   ED Treatments / Results  Labs (all labs ordered are listed, but only abnormal results are displayed) Labs Reviewed - No data to display  EKG  EKG Interpretation  Date/Time:  Wednesday February 20 2017 16:58:27 EDT Ventricular Rate:  101 PR Interval:    QRS Duration: 53 QT Interval:  436 QTC Calculation: 566 R Axis:   79 Text Interpretation:  Sinus tachycardia Atrial premature complex LVH with secondary repolarization abnormality Anterior Q waves, possibly due to LVH Prolonged QT interval Abnormal ekg Confirmed by Eretria Manternach MD, Morine Kohlman ([id number]) on 02/20/2017 5:04:54 PM  Radiology Dg Shoulder Right  Result Date: 02/20/2017 CLINICAL DATA:  Fall.  Right arm pain. EXAM: RIGHT SHOULDER - 2+ VIEW COMPARISON:  Right humerus images performed today. FINDINGS: There is a right humeral neck fracture with mild impaction. This is better seen on the humerus series. No subluxation or dislocation. IMPRESSION: Mildly impacted right humeral neck fracture. Electronically Signed   By: Charlett Nose M.D.   On: 02/20/2017 18:05   Dg Elbow Complete Right  Result Date: 02/20/2017 CLINICAL DATA:  Fall in shower this morning.  Right arm pain. EXAM: RIGHT ELBOW - COMPLETE 3+ VIEW COMPARISON:  Right humerus series performed today. FINDINGS: No acute bony abnormality. Specifically, no fracture, subluxation, or dislocation. Soft tissues are intact. No visible joint effusion. IMPRESSION: No acute bony abnormality. Electronically Signed   By: Charlett Nose M.D.   On: 02/20/2017 18:06   Ct Head Wo Contrast  Result Date: 02/20/2017 CLINICAL DATA:  Fall in bathtub today. Possible head injury. Initial encounter. EXAM: CT HEAD WITHOUT CONTRAST CT CERVICAL SPINE WITHOUT CONTRAST TECHNIQUE: Multidetector CT imaging of the  head and cervical spine was performed following the standard protocol without intravenous contrast. Multiplanar CT image reconstructions of the cervical spine were also generated. COMPARISON:  None. FINDINGS: CT HEAD FINDINGS Brain: Advanced atrophy and diffuse white matter disease is present bilaterally. Remote lacunar infarcts are present in the basal ganglia bilaterally. White matter changes extend into the brainstem. No acute cortical infarct, hemorrhage, or mass lesion is present. Ventricles are proportionate to the degree of atrophy. No significant extra-axial fluid collection is present. Vascular: Atherosclerotic calcifications are present within the cavernous internal carotid arteries bilaterally. There is no hyperdense vessel. Skull: No significant extracranial soft tissue injury is present. Calvarium is intact. No acute fractures present. No focal lytic or blastic lesions are evident. Sinuses/Orbits: Chronic right sphenoid sinus opacification is present. The remaining paranasal sinuses and the mastoid air cells are clear. Bilateral lens replacements are present. The globes and orbits are otherwise within normal limits. CT CERVICAL SPINE FINDINGS Alignment: AP alignment is anatomic. Skull base and vertebrae: Craniocervical junction is within normal limits. No acute fracture or traumatic subluxation is present. Soft tissues and spinal canal: The soft tissues of the neck demonstrate bilateral atherosclerotic calcifications of the carotid bifurcations. No mass lesion is present. The thyroid is within normal limits. Disc levels: There is fusion across the disc space at C3-4, C4-5, C5-6, and C6-7. Uncovertebral disease leads to foraminal stenosis, most severe at C4-5 at C5-6, left greater than right. Upper chest: Emphysematous changes are present at the lung apices. There is no focal nodule or mass lesion. There is some pleural scarring. IMPRESSION: 1. No acute intracranial abnormality or evidence for acute  trauma. 2. Advanced atrophy and white matter disease likely reflects the sequela of chronic microvascular ischemia. 3. Advanced spondylosis of the cervical spine without evidence for acute fracture or traumatic subluxation. 4. Centrilobular emphysema. Electronically Signed   By: Marin Roberts M.D.   On: 02/20/2017 18:33   Ct Cervical Spine Wo Contrast  Result Date: 02/20/2017 CLINICAL DATA:  Fall in bathtub today. Possible head injury. Initial encounter. EXAM: CT HEAD WITHOUT CONTRAST CT CERVICAL SPINE WITHOUT CONTRAST TECHNIQUE: Multidetector CT imaging of the head and cervical spine was performed following the standard protocol without intravenous contrast. Multiplanar CT image reconstructions of the cervical spine were also generated. COMPARISON:  None. FINDINGS: CT HEAD FINDINGS Brain: Advanced atrophy and diffuse white matter disease is present bilaterally. Remote lacunar infarcts are present  in the basal ganglia bilaterally. White matter changes extend into the brainstem. No acute cortical infarct, hemorrhage, or mass lesion is present. Ventricles are proportionate to the degree of atrophy. No significant extra-axial fluid collection is present. Vascular: Atherosclerotic calcifications are present within the cavernous internal carotid arteries bilaterally. There is no hyperdense vessel. Skull: No significant extracranial soft tissue injury is present. Calvarium is intact. No acute fractures present. No focal lytic or blastic lesions are evident. Sinuses/Orbits: Chronic right sphenoid sinus opacification is present. The remaining paranasal sinuses and the mastoid air cells are clear. Bilateral lens replacements are present. The globes and orbits are otherwise within normal limits. CT CERVICAL SPINE FINDINGS Alignment: AP alignment is anatomic. Skull base and vertebrae: Craniocervical junction is within normal limits. No acute fracture or traumatic subluxation is present. Soft tissues and spinal  canal: The soft tissues of the neck demonstrate bilateral atherosclerotic calcifications of the carotid bifurcations. No mass lesion is present. The thyroid is within normal limits. Disc levels: There is fusion across the disc space at C3-4, C4-5, C5-6, and C6-7. Uncovertebral disease leads to foraminal stenosis, most severe at C4-5 at C5-6, left greater than right. Upper chest: Emphysematous changes are present at the lung apices. There is no focal nodule or mass lesion. There is some pleural scarring. IMPRESSION: 1. No acute intracranial abnormality or evidence for acute trauma. 2. Advanced atrophy and white matter disease likely reflects the sequela of chronic microvascular ischemia. 3. Advanced spondylosis of the cervical spine without evidence for acute fracture or traumatic subluxation. 4. Centrilobular emphysema. Electronically Signed   By: Marin Roberts M.D.   On: 02/20/2017 18:33   Dg Humerus Right  Result Date: 02/20/2017 CLINICAL DATA:  Fall in shower today.  Right arm pain. EXAM: RIGHT HUMERUS - 2+ VIEW COMPARISON:  Right shoulder and elbow series today. FINDINGS: There is a right humeral neck fracture. Minimal displacement. No subluxation or dislocation. No additional humeral abnormality. IMPRESSION: Minimally displaced right humeral neck fracture. Electronically Signed   By: Charlett Nose M.D.   On: 02/20/2017 18:06    Procedures Procedures (including critical care time)  Medications Ordered in ED Medications  fentaNYL (SUBLIMAZE) injection 25 mcg (25 mcg Intravenous Given 02/20/17 1713)  traMADol (ULTRAM) tablet 50 mg (50 mg Oral Given 02/20/17 1907)     Initial Impression / Assessment and Plan / ED Course  I have reviewed the triage vital signs and the nursing notes.  Pertinent labs & imaging results that were available during my care of the patient were reviewed by me and considered in my medical decision making (see chart for details).     Presenting after fall with  right shoulder pain. History of dementia, unable to fully recall accident. No significant head injury noted on exam, but CT head and cervical spine performed in case of head injury. This is visualized and shows no acute traumatic injuries. With evidence of deformity and swelling to the right shoulder, with x-ray visualized that shows radial head fracture. Extremity is neurovascularly intact distally. No other evidence of acute injuries on exam. She is at mental status baseline. He is felt to be stable for discharge home with sling and outpatient follow-up. Strict return and follow-up instructions reviewed with daugther. She expressed understanding of all discharge instructions and felt comfortable with the plan of care.   Final Clinical Impressions(s) / ED Diagnoses   Final diagnoses:  Fall, initial encounter  Humeral head fracture, right, closed, initial encounter    New Prescriptions New  Prescriptions   POLYETHYLENE GLYCOL (MIRALAX / GLYCOLAX) PACKET    Take 17 g by mouth daily.   TRAMADOL (ULTRAM) 50 MG TABLET    Take 1 tablet (50 mg total) by mouth every 6 (six) hours as needed for moderate pain or severe pain.     Lavera Guise, MD 02/20/17 2020

## 2017-02-20 NOTE — ED Notes (Signed)
Pt a/o x4.  Pt and daughter expresses understanding of discharge paperwork and follow-up appts,  Pt escorted from ED via wheelchair.

## 2017-02-20 NOTE — ED Notes (Signed)
EKG given to Dr. Liu.  

## 2017-03-29 DEATH — deceased

## 2018-12-21 IMAGING — DX DG ELBOW COMPLETE 3+V*R*
4 series · 4 of 4 positions shown · non-contrast
Comparison: Right humerus series performed today.

CLINICAL DATA: Fall in shower this morning.  Right arm pain.

EXAM:
RIGHT ELBOW - COMPLETE 3+ VIEW

[elbow ap]
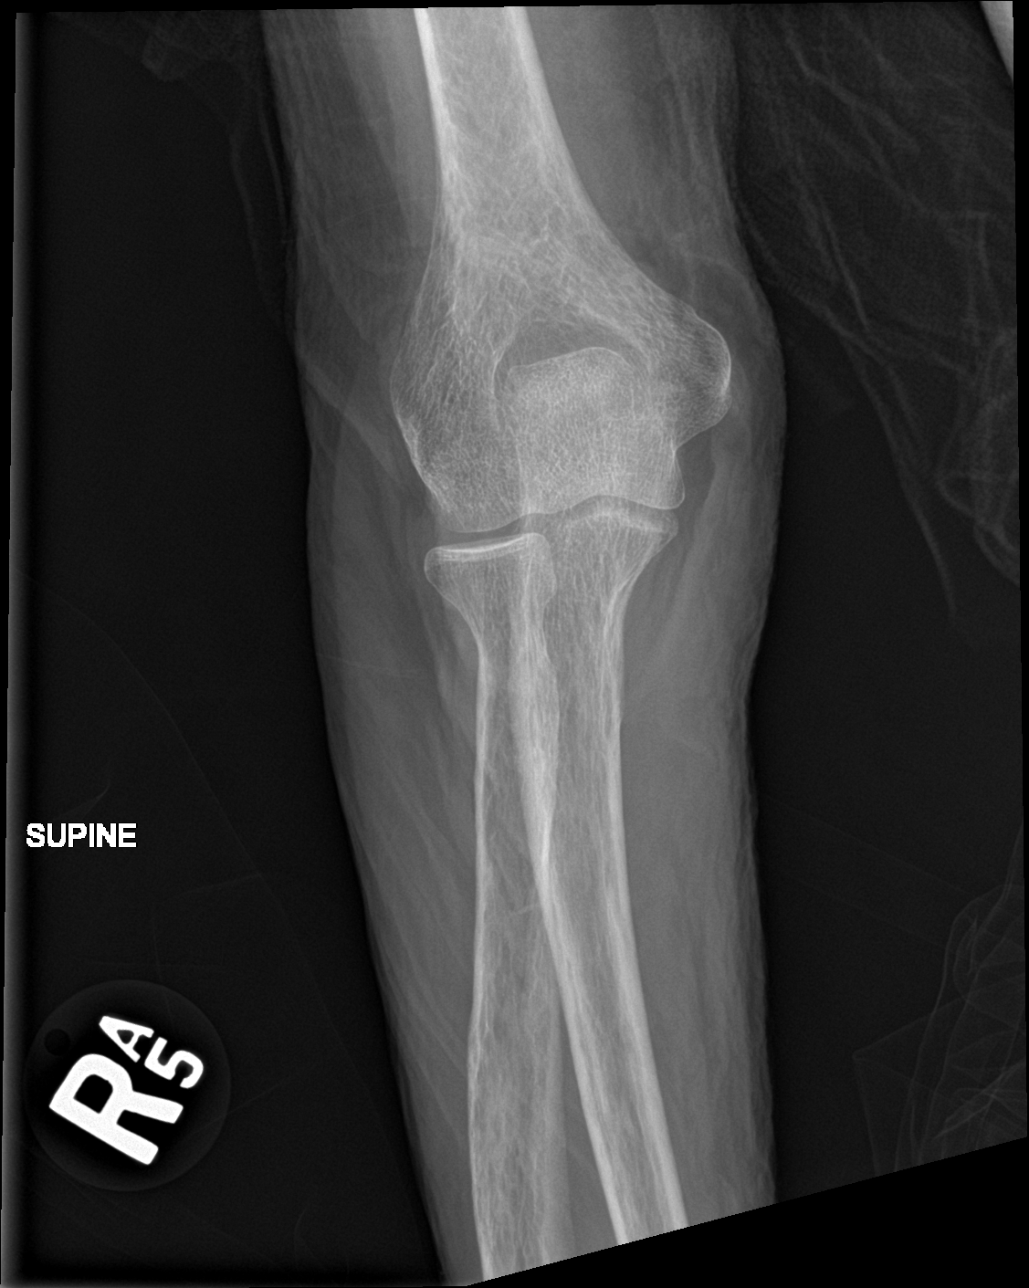

[elbow obl (1 of 2)]
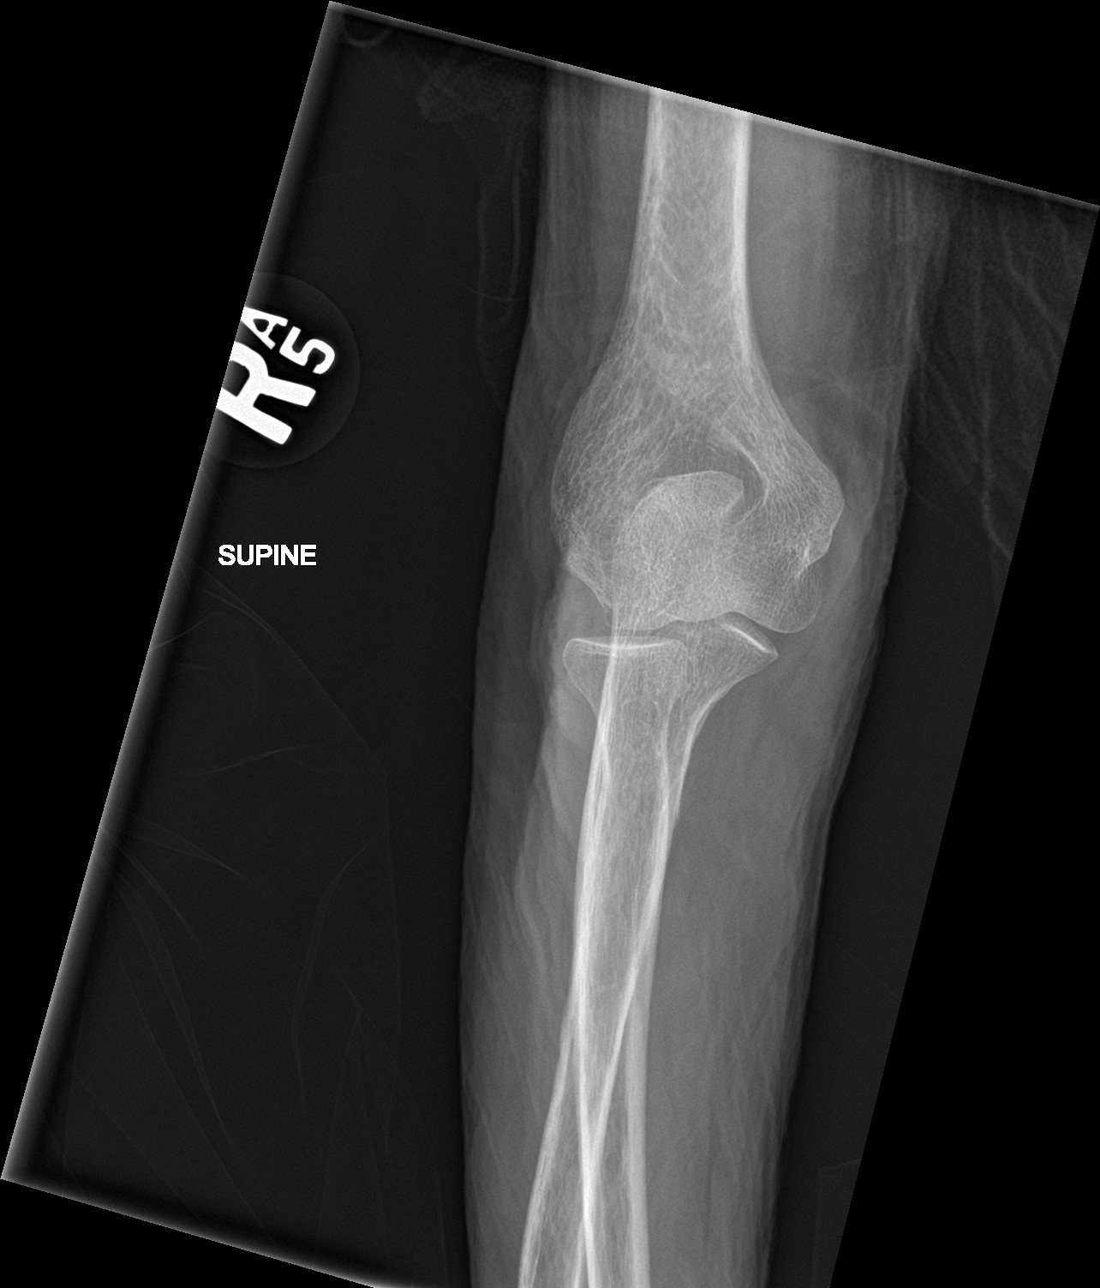

[elbow obl (2 of 2)]
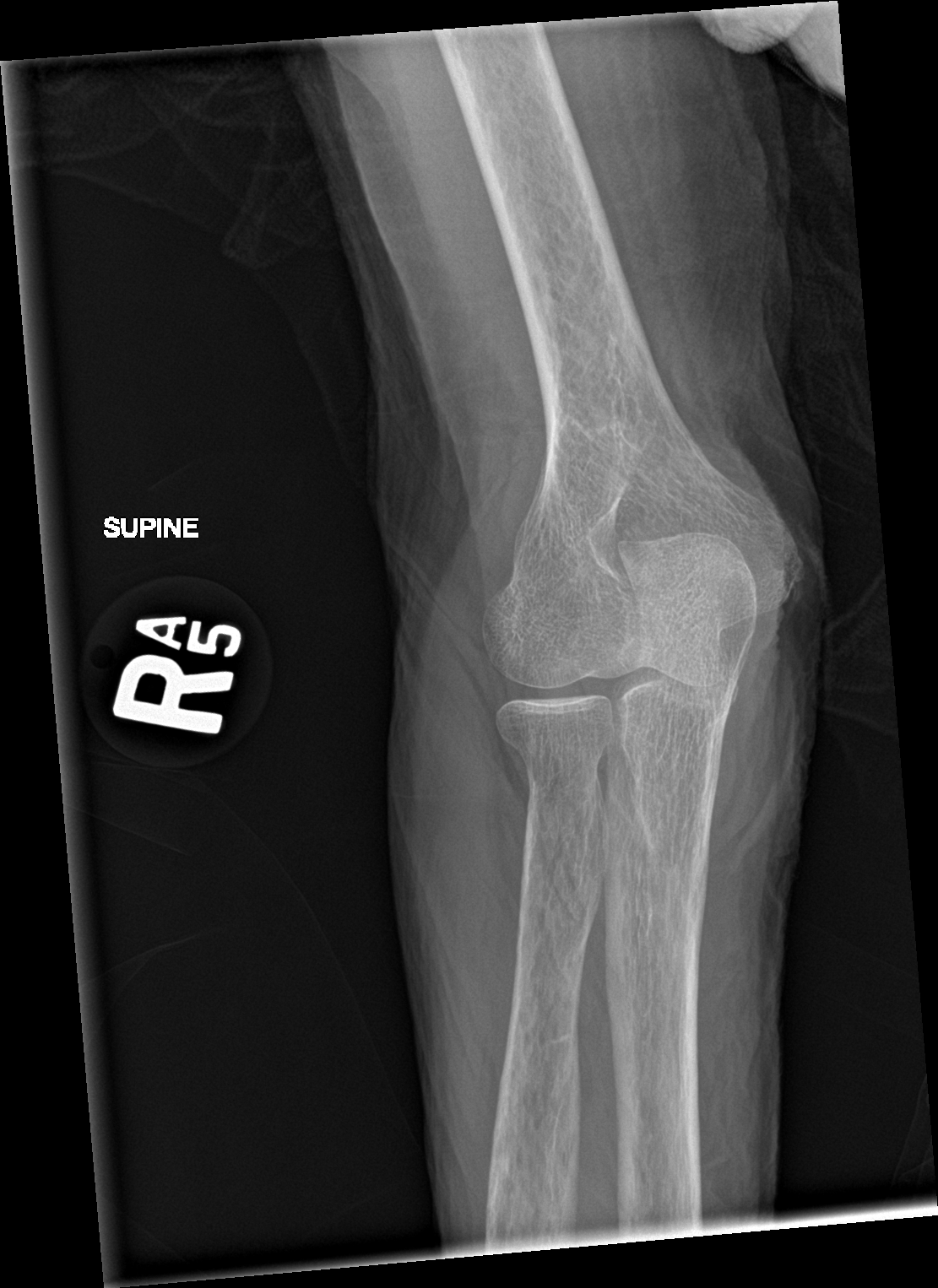

[elbow lat]
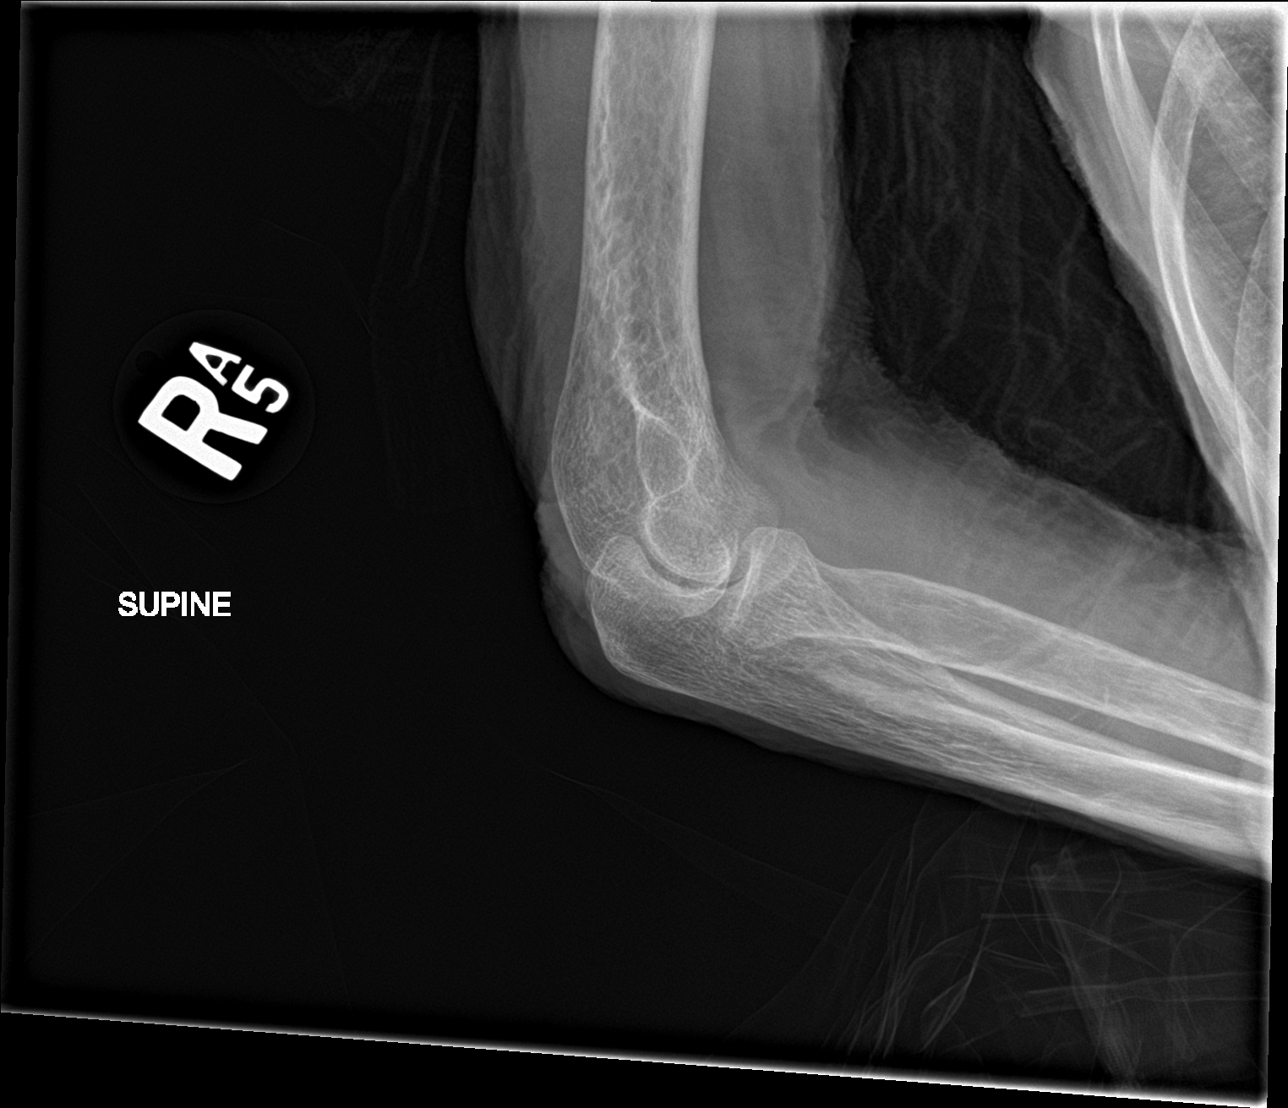

[4 of 4 positions shown; findings below may reference images not displayed]

FINDINGS: No acute bony abnormality. Specifically, no fracture, subluxation,
or dislocation. Soft tissues are intact. No visible joint effusion.
IMPRESSION: No acute bony abnormality.
